# Patient Record
Sex: Female | Born: 2000 | Race: White | Hispanic: No | Marital: Single | Smoking: Never smoker
Health system: Southern US, Community
[De-identification: ages and names within clinical notes are randomized; demographics above are authoritative.]

## PROBLEM LIST (undated history)

## (undated) DIAGNOSIS — R51 Headache: Secondary | ICD-10-CM

## (undated) HISTORY — DX: Headache: R51

## (undated) HISTORY — PX: HERNIA REPAIR: SHX51

---
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
## 2022-02-05 DIAGNOSIS — H5213 Myopia, bilateral: Secondary | ICD-10-CM | POA: Diagnosis not present

## 2022-03-30 DIAGNOSIS — L700 Acne vulgaris: Secondary | ICD-10-CM | POA: Diagnosis not present
